# Patient Record
Sex: Female | Born: 1955 | Race: Black or African American | Hispanic: No | Smoking: Never smoker
Health system: Southern US, Community
[De-identification: ages and names within clinical notes are randomized; demographics above are authoritative.]

## PROBLEM LIST (undated history)

## (undated) DIAGNOSIS — E119 Type 2 diabetes mellitus without complications: Secondary | ICD-10-CM

## (undated) HISTORY — PX: OVARIAN CYST REMOVAL: SHX89

---

## 2004-02-13 IMAGING — MG MM DIGITAL SCREENING BILAT W/ CAD
1 series · 6 of 6 positions shown · non-contrast
Comparison: none

REASON FOR EXAM: BAIAT screening...hm [PHONE_NUMBER]

Procedure: DIGITAL BILATERAL SCREENING MAMMOGRAPHY WITH CAD
 Dense nodular parenchymal pattern is present. CAD evaluation is nonfocal.
Routine yearly follow up is suggested.

[R MLO · right · 6 of 6 slices shown]
[im 1/6]
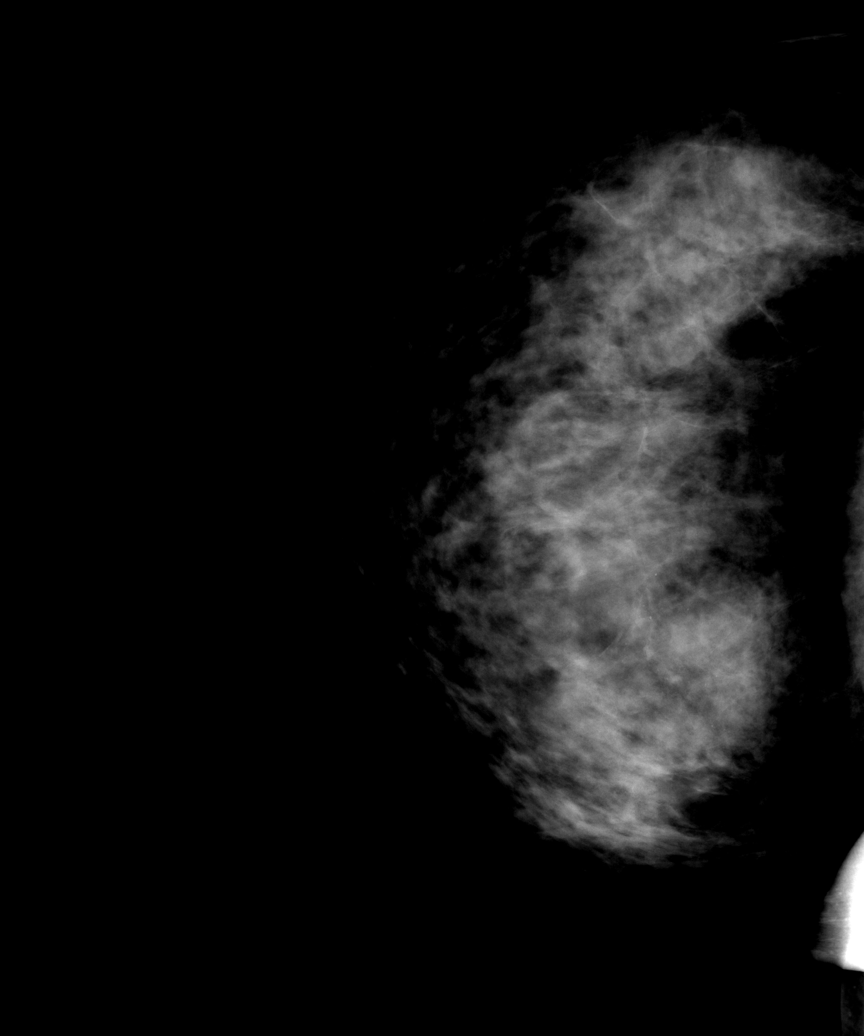
[im 2/6]
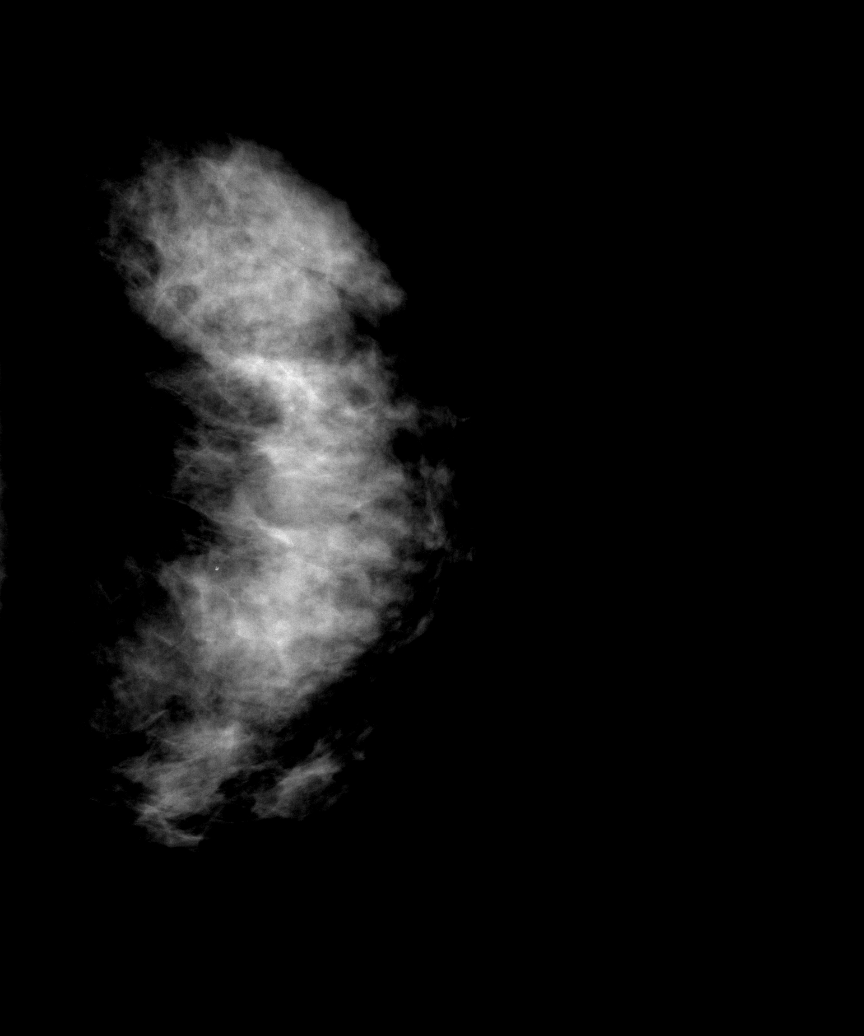
[im 3/6]
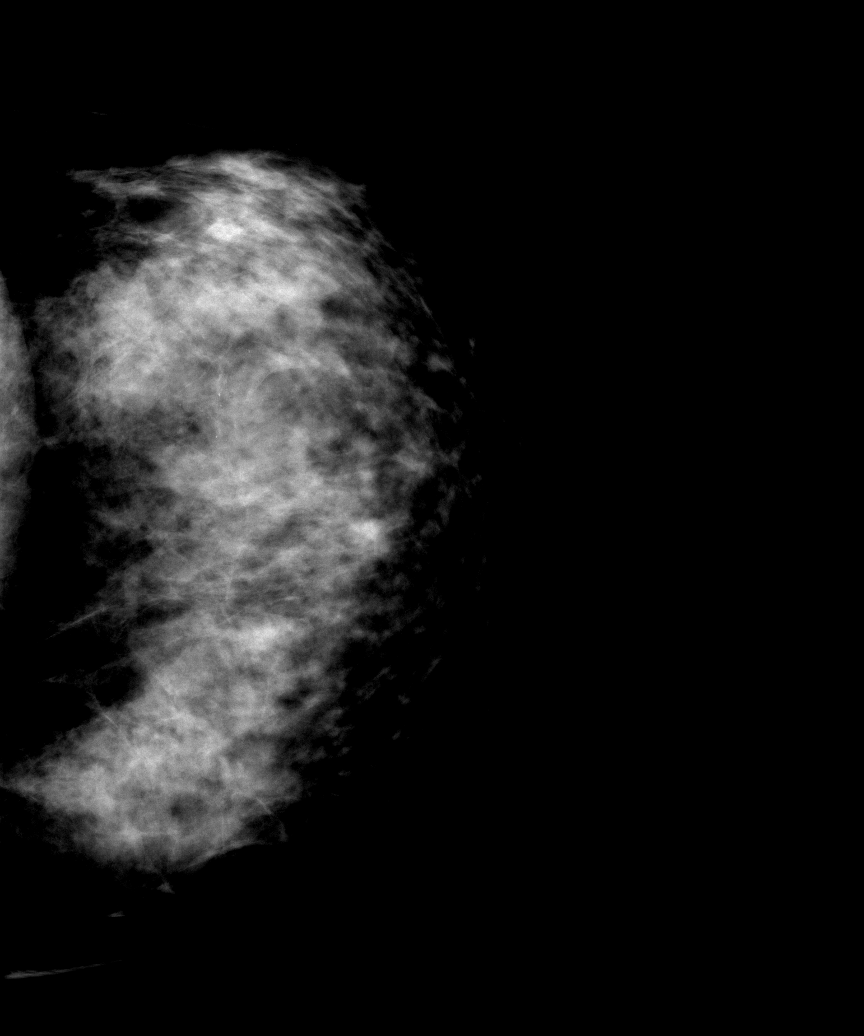
[im 4/6]
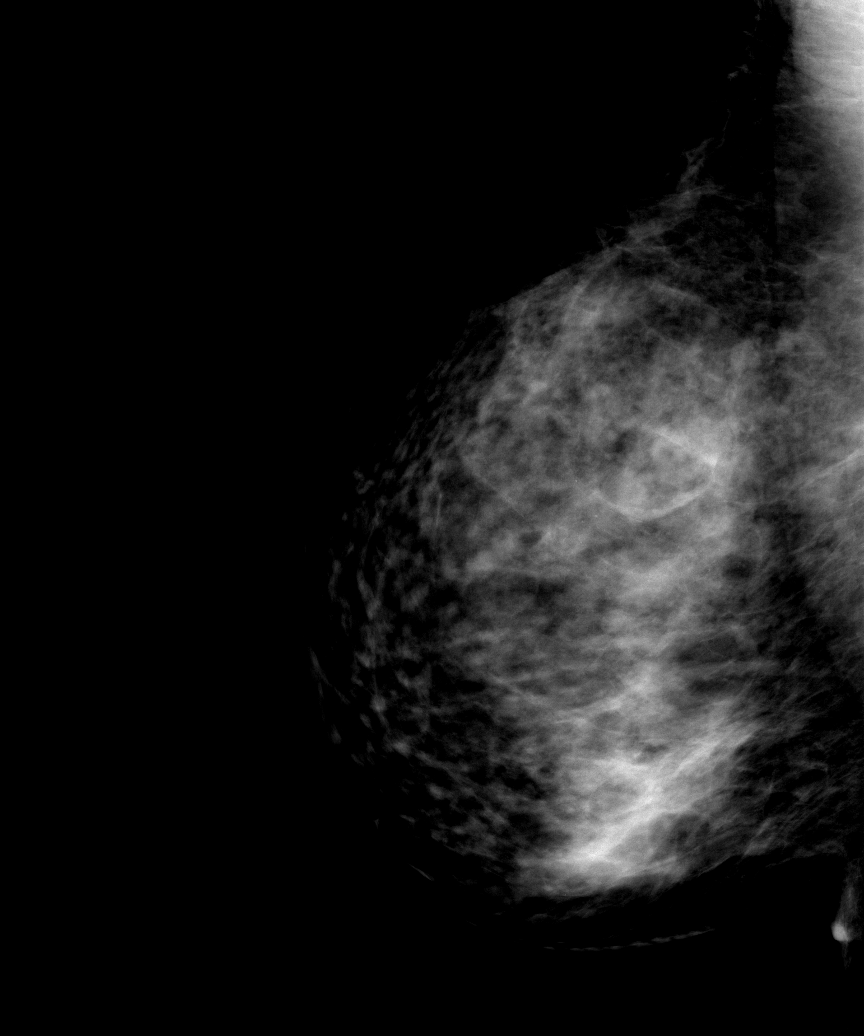
[im 5/6]
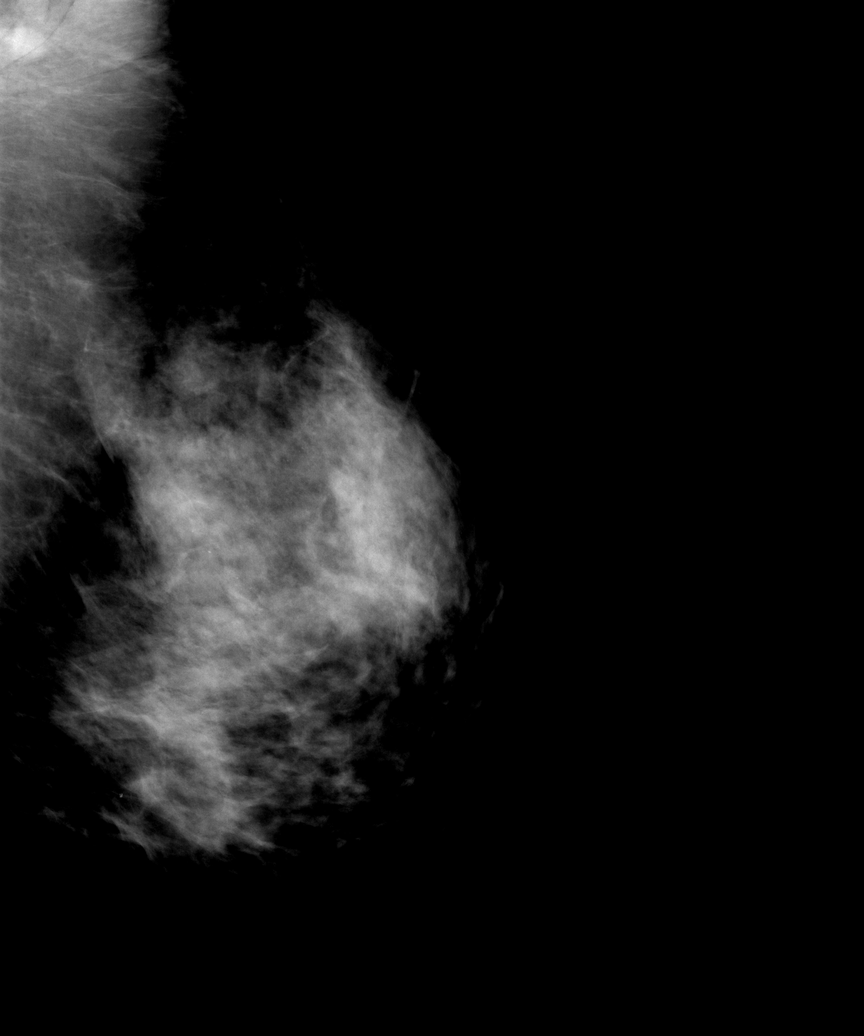
[im 6/6]
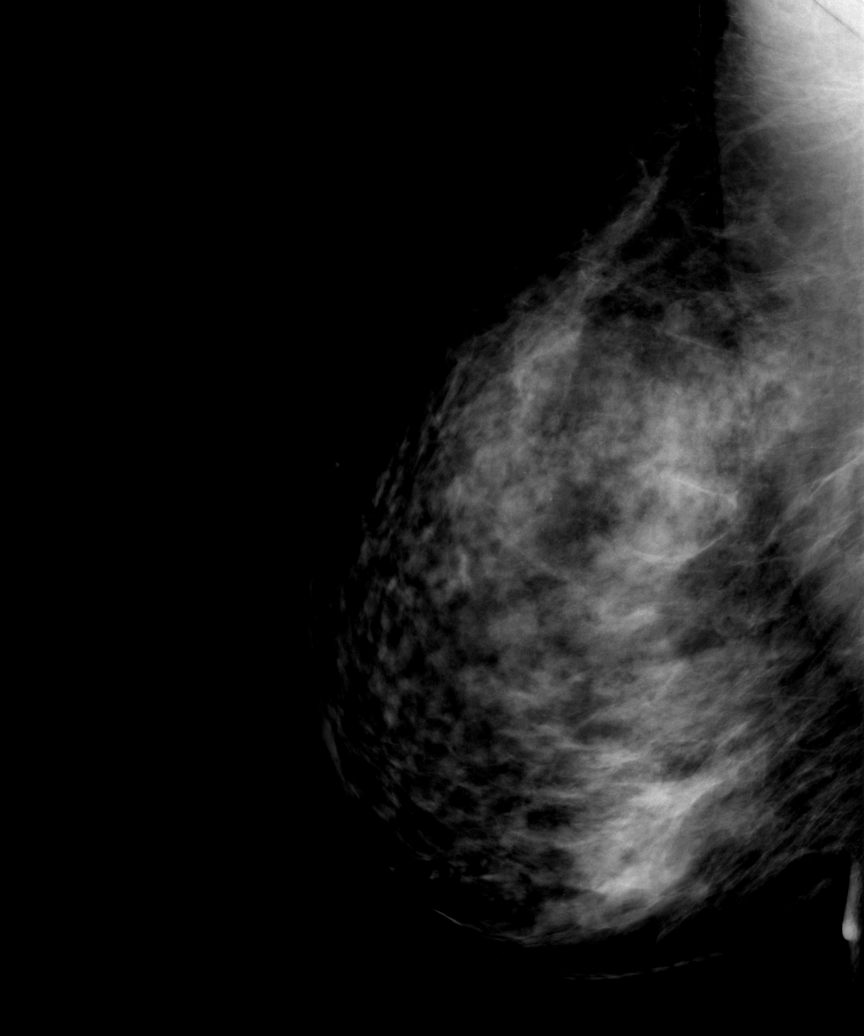

[6 of 6 positions shown; findings below may reference images not displayed]

IMPRESSION: Stable benign exam. Yearly follow up mammogram is suggested. BI-RADS:
Category 2-Benign Findings.

 A NEGATIVE MAMMOGRAM REPORT DOES NOT PRECLUDE BIOPSY OR OTHER EVALUATION OF
A CLINICALLY PALPABLE OR OTHERWISE SUSPICIOUS MASS OR LESION. BREAST CANCER
MAY NOT BE DETECTED BY MAMMOGRAPHY IN UP TO 10% OF CASES.

## 2016-08-05 ENCOUNTER — Other Ambulatory Visit: Payer: Self-pay | Admitting: Family Medicine

## 2016-08-05 DIAGNOSIS — N6002 Solitary cyst of left breast: Secondary | ICD-10-CM

## 2016-08-09 ENCOUNTER — Ambulatory Visit
Admission: RE | Admit: 2016-08-09 | Discharge: 2016-08-09 | Disposition: A | Discharge status: Home / Self Care | Payer: BLUE CROSS/BLUE SHIELD | Source: Ambulatory Visit | Attending: Family Medicine | Admitting: Family Medicine

## 2016-08-09 ENCOUNTER — Other Ambulatory Visit: Payer: Self-pay | Admitting: Family Medicine

## 2016-08-09 ENCOUNTER — Other Ambulatory Visit: Payer: Self-pay

## 2016-08-09 DIAGNOSIS — N6002 Solitary cyst of left breast: Secondary | ICD-10-CM

## 2016-08-24 ENCOUNTER — Ambulatory Visit: Payer: Self-pay | Admitting: General Surgery

## 2016-08-24 DIAGNOSIS — N6092 Unspecified benign mammary dysplasia of left breast: Secondary | ICD-10-CM

## 2016-08-28 ENCOUNTER — Telehealth: Payer: Self-pay | Admitting: Hematology

## 2016-08-28 ENCOUNTER — Encounter: Payer: Self-pay | Admitting: Hematology

## 2016-08-28 NOTE — Telephone Encounter (Signed)
Appt has been scheduled for the pt to see Dr.Feng on 3/26 at 11am. Demographics verified. Letter mailed.

## 2016-09-20 ENCOUNTER — Encounter: Payer: BLUE CROSS/BLUE SHIELD | Admitting: Hematology

## 2016-09-27 ENCOUNTER — Telehealth: Payer: Self-pay | Admitting: Hematology

## 2016-09-27 NOTE — Telephone Encounter (Signed)
Tc to the pt to see if she would like to reschedule her appt. Pt stated that she wants to wait until after she speaks to Dr. Carolynne Edouard at [facility]. She states that she will cb when she's ready to schedule. Referral cancelled until then.

## 2016-09-29 ENCOUNTER — Other Ambulatory Visit: Payer: Self-pay | Admitting: General Surgery

## 2016-09-29 DIAGNOSIS — N6092 Unspecified benign mammary dysplasia of left breast: Secondary | ICD-10-CM

## 2016-10-12 ENCOUNTER — Encounter (HOSPITAL_COMMUNITY): Payer: Self-pay | Admitting: *Deleted

## 2016-10-12 ENCOUNTER — Encounter (HOSPITAL_COMMUNITY)
Admission: RE | Admit: 2016-10-12 | Discharge: 2016-10-12 | Disposition: A | Discharge status: Home / Self Care | Payer: BLUE CROSS/BLUE SHIELD | Source: Ambulatory Visit | Attending: General Surgery | Admitting: General Surgery

## 2016-10-12 DIAGNOSIS — N6092 Unspecified benign mammary dysplasia of left breast: Secondary | ICD-10-CM | POA: Insufficient documentation

## 2016-10-12 DIAGNOSIS — Z01810 Encounter for preprocedural cardiovascular examination: Secondary | ICD-10-CM | POA: Diagnosis not present

## 2016-10-12 DIAGNOSIS — Z01818 Encounter for other preprocedural examination: Secondary | ICD-10-CM | POA: Diagnosis not present

## 2016-10-12 HISTORY — DX: Type 2 diabetes mellitus without complications: E11.9

## 2016-10-12 LAB — CBC
HCT: 37 % (ref 36.0–46.0)
HEMOGLOBIN: 11.8 g/dL — AB (ref 12.0–15.0)
MCH: 28.8 pg (ref 26.0–34.0)
MCHC: 31.9 g/dL (ref 30.0–36.0)
MCV: 90.2 fL (ref 78.0–100.0)
PLATELETS: 414 10*3/uL — AB (ref 150–400)
RBC: 4.1 MIL/uL (ref 3.87–5.11)
RDW: 12.2 % (ref 11.5–15.5)
WBC: 5 10*3/uL (ref 4.0–10.5)

## 2016-10-12 LAB — BASIC METABOLIC PANEL
ANION GAP: 9 (ref 5–15)
BUN: 12 mg/dL (ref 6–20)
CALCIUM: 9 mg/dL (ref 8.9–10.3)
CO2: 25 mmol/L (ref 22–32)
CREATININE: 1.02 mg/dL — AB (ref 0.44–1.00)
Chloride: 103 mmol/L (ref 101–111)
GFR, EST NON AFRICAN AMERICAN: 59 mL/min — AB (ref >60)
Glucose, Bld: 146 mg/dL — ABNORMAL HIGH (ref 65–99)
Potassium: 3.7 mmol/L (ref 3.5–5.1)
Sodium: 137 mmol/L (ref 135–145)

## 2016-10-12 LAB — GLUCOSE, CAPILLARY: Glucose-Capillary: 136 mg/dL — ABNORMAL HIGH (ref 65–99)

## 2016-10-12 NOTE — Pre-Procedure Instructions (Addendum)
Katherine May  10/12/2016      [facility] [address] [place], [place] - [address] [place] [address] Phone: [phone] Fax: [phone]    Your procedure is scheduled on 10/18/16  Report to [facility] Admitting at  1030   A.M.  Call this number if you have problems the morning of surgery:  [phone]   Remember:  Do not eat food or drink liquids after midnight.  Take these medicines the morning of surgery with A SIP OF WATER  Tylenol if needed           Drink small bottle of water  (given to you at preadmit visit) at 830  STOP all herbel meds, nsaids (aleve,naproxen,advil,ibuprofen)  Starting TODAY (10/12/16) including all vitamins/supplements, aspirin  No actos day of surgery     How to Manage Your Diabetes Before and After Surgery  Why is it important to control my blood sugar before and after surgery? . Improving blood sugar levels before and after surgery helps healing and can limit problems. . A way of improving blood sugar control is eating a healthy diet by: o  Eating less sugar and carbohydrates o  Increasing activity/exercise o  Talking with your doctor about reaching your blood sugar goals . High blood sugars (greater than 180 mg/dL) can raise your risk of infections and slow your recovery, so you will need to focus on controlling your diabetes during the weeks before surgery. . Make sure that the doctor who takes care of your diabetes knows about your planned surgery including the date and location.  How do I manage my blood sugar before surgery? . Check your blood sugar at least 4 times a day, starting 2 days before surgery, to make sure that the level is not too high or low. o Check your blood sugar the morning of your surgery when you wake up and every 2 hours until you get to the Short Stay unit. . If your blood sugar is less than 70 mg/dL, you will need to treat for low blood sugar: o Do not take  insulin. o Treat a low blood sugar (less than 70 mg/dL) with  cup of clear juice (cranberry or apple), 4 glucose tablets, OR glucose gel. o Recheck blood sugar in 15 minutes after treatment (to make sure it is greater than 70 mg/dL). If your blood sugar is not greater than 70 mg/dL on recheck, call [phone] for further instructions. . Report your blood sugar to the short stay nurse when you get to Short Stay.  . If you are admitted to the hospital after surgery: o Your blood sugar will be checked by the staff and you will probably be given insulin after surgery (instead of oral diabetes medicines) to make sure you have good blood sugar levels. o The goal for blood sugar control after surgery is 80-180 mg/dL.   WHAT DO I DO ABOUT MY DIABETES MEDICATION?  Marland Kitchen Do not take oral diabetes medicines (pills) the morning of surgery.No actos day of surgery    Do not wear jewelry, make-up or nail polish.  Do not wear lotions, powders, or perfumes, or deoderant.  Do not shave 48 hours prior to surgery.  Men may shave face and neck.  Do not bring valuables to the hospital.  [facility] is not responsible for any belongings or valuables.  Contacts, dentures or bridgework may not be worn into surgery.  Leave  your suitcase in the car.  After surgery it may be brought to your room.  For patients admitted to the hospital, discharge time will be determined by your treatment team.  Patients discharged the day of surgery will not be allowed to drive home.   Special instructions:   Special Instructions: [facility] - Preparing for Surgery  Before surgery, you can play an important role.  Because skin is not sterile, your skin needs to be as free of germs as possible.  You can reduce the number of germs on you skin by washing with CHG (chlorahexidine gluconate) soap before surgery.  CHG is an antiseptic cleaner which kills germs and bonds with the skin to continue killing germs even after washing.  Please  DO NOT use if you have an allergy to CHG or antibacterial soaps.  If your skin becomes reddened/irritated stop using the CHG and inform your nurse when you arrive at Short Stay.  Do not shave (including legs and underarms) for at least 48 hours prior to the first CHG shower.  You may shave your face.  Please follow these instructions carefully:   1.  Shower with CHG Soap the night before surgery and the morning of Surgery.  2.  If you choose to wash your hair, wash your hair first as usual with your normal shampoo.  3.  After you shampoo, rinse your hair and body thoroughly to remove the Shampoo.  4.  Use CHG as you would any other liquid soap.  You can apply chg directly  to the skin and wash gently with scrungie or a clean washcloth.  5.  Apply the CHG Soap to your body ONLY FROM THE NECK DOWN.  Do not use on open wounds or open sores.  Avoid contact with your eyes ears, mouth and genitals (private parts).  Wash genitals (private parts)       with your normal soap.  6.  Wash thoroughly, paying special attention to the area where your surgery will be performed.  7.  Thoroughly rinse your body with warm water from the neck down.  8.  DO NOT shower/wash with your normal soap after using and rinsing off the CHG Soap.  9.  Pat yourself dry with a clean towel.            10.  Wear clean pajamas.            11.  Place clean sheets on your bed the night of your first shower and do not sleep with pets.  Day of Surgery  Do not apply any lotions/deodorants the morning of surgery.  Please wear clean clothes to the hospital/surgery center.  Please read over the  fact sheets that you were given.

## 2016-10-13 LAB — HEMOGLOBIN A1C
Hgb A1c MFr Bld: 6.7 % — ABNORMAL HIGH (ref 4.8–5.6)
Mean Plasma Glucose: 146 mg/dL

## 2016-10-15 ENCOUNTER — Ambulatory Visit
Admission: RE | Admit: 2016-10-15 | Discharge: 2016-10-15 | Disposition: A | Discharge status: Home / Self Care | Payer: BLUE CROSS/BLUE SHIELD | Source: Ambulatory Visit | Attending: General Surgery | Admitting: General Surgery

## 2016-10-15 DIAGNOSIS — N6092 Unspecified benign mammary dysplasia of left breast: Secondary | ICD-10-CM

## 2016-10-18 ENCOUNTER — Ambulatory Visit (HOSPITAL_COMMUNITY): Payer: BLUE CROSS/BLUE SHIELD | Admitting: Anesthesiology

## 2016-10-18 ENCOUNTER — Ambulatory Visit (HOSPITAL_COMMUNITY)
Admission: RE | Admit: 2016-10-18 | Discharge: 2016-10-18 | Disposition: A | Discharge status: Home / Self Care | Payer: BLUE CROSS/BLUE SHIELD | Source: Ambulatory Visit | Attending: General Surgery | Admitting: General Surgery

## 2016-10-18 ENCOUNTER — Encounter (HOSPITAL_COMMUNITY): Payer: Self-pay | Admitting: *Deleted

## 2016-10-18 ENCOUNTER — Encounter (HOSPITAL_COMMUNITY)
Admission: RE | Disposition: A | Discharge status: Home / Self Care | Payer: Self-pay | Source: Ambulatory Visit | Attending: General Surgery

## 2016-10-18 ENCOUNTER — Ambulatory Visit
Admission: RE | Admit: 2016-10-18 | Discharge: 2016-10-18 | Disposition: A | Discharge status: Home / Self Care | Payer: BLUE CROSS/BLUE SHIELD | Source: Ambulatory Visit | Attending: General Surgery | Admitting: General Surgery

## 2016-10-18 DIAGNOSIS — N62 Hypertrophy of breast: Secondary | ICD-10-CM | POA: Diagnosis not present

## 2016-10-18 DIAGNOSIS — E119 Type 2 diabetes mellitus without complications: Secondary | ICD-10-CM | POA: Diagnosis not present

## 2016-10-18 DIAGNOSIS — Z7982 Long term (current) use of aspirin: Secondary | ICD-10-CM | POA: Insufficient documentation

## 2016-10-18 DIAGNOSIS — N6092 Unspecified benign mammary dysplasia of left breast: Secondary | ICD-10-CM | POA: Diagnosis present

## 2016-10-18 DIAGNOSIS — Z7984 Long term (current) use of oral hypoglycemic drugs: Secondary | ICD-10-CM | POA: Insufficient documentation

## 2016-10-18 DIAGNOSIS — Z803 Family history of malignant neoplasm of breast: Secondary | ICD-10-CM | POA: Diagnosis not present

## 2016-10-18 DIAGNOSIS — Z79899 Other long term (current) drug therapy: Secondary | ICD-10-CM | POA: Diagnosis not present

## 2016-10-18 HISTORY — PX: BREAST LUMPECTOMY WITH RADIOACTIVE SEED LOCALIZATION: SHX6424

## 2016-10-18 LAB — GLUCOSE, CAPILLARY
GLUCOSE-CAPILLARY: 76 mg/dL (ref 65–99)
Glucose-Capillary: 82 mg/dL (ref 65–99)
Glucose-Capillary: 93 mg/dL (ref 65–99)

## 2016-10-18 SURGERY — BREAST LUMPECTOMY WITH RADIOACTIVE SEED LOCALIZATION
Anesthesia: General | Site: Breast | Laterality: Left

## 2016-10-18 MED ORDER — LIDOCAINE 2% (20 MG/ML) 5 ML SYRINGE
INTRAMUSCULAR | Status: AC
  Filled 2016-10-18: qty 5

## 2016-10-18 MED ORDER — MIDAZOLAM HCL 5 MG/5ML IJ SOLN
INTRAMUSCULAR | Status: DC | PRN
  Administered 2016-10-18: 2 mg via INTRAVENOUS

## 2016-10-18 MED ORDER — FENTANYL CITRATE (PF) 100 MCG/2ML IJ SOLN
INTRAMUSCULAR | Status: AC
  Administered 2016-10-18: 50 ug via INTRAVENOUS
  Filled 2016-10-18: qty 2

## 2016-10-18 MED ORDER — CEFAZOLIN SODIUM-DEXTROSE 2-4 GM/100ML-% IV SOLN
2.0000 g | INTRAVENOUS | Status: AC
  Administered 2016-10-18: 2 g via INTRAVENOUS
  Filled 2016-10-18: qty 100

## 2016-10-18 MED ORDER — CHLORHEXIDINE GLUCONATE CLOTH 2 % EX PADS: 6.0000 | MEDICATED_PAD | Freq: Once | CUTANEOUS | Status: DC

## 2016-10-18 MED ORDER — BUPIVACAINE HCL (PF) 0.25 % IJ SOLN
INTRAMUSCULAR | Status: DC | PRN
Start: 2016-10-18 — End: 2016-10-18
  Administered 2016-10-18: 10 mL

## 2016-10-18 MED ORDER — PROPOFOL 10 MG/ML IV BOLUS
INTRAVENOUS | Status: DC | PRN
  Administered 2016-10-18: 160 mg via INTRAVENOUS

## 2016-10-18 MED ORDER — DEXAMETHASONE SODIUM PHOSPHATE 10 MG/ML IJ SOLN
INTRAMUSCULAR | Status: DC | PRN
  Administered 2016-10-18: 10 mg via INTRAVENOUS

## 2016-10-18 MED ORDER — HYDROCODONE-ACETAMINOPHEN 5-325 MG PO TABS
ORAL_TABLET | ORAL | Status: AC
  Administered 2016-10-18: 1 via ORAL
  Filled 2016-10-18: qty 1

## 2016-10-18 MED ORDER — BUPIVACAINE HCL (PF) 0.25 % IJ SOLN
INTRAMUSCULAR | Status: AC
  Filled 2016-10-18: qty 30

## 2016-10-18 MED ORDER — FENTANYL CITRATE (PF) 100 MCG/2ML IJ SOLN
25.0000 ug | INTRAMUSCULAR | Status: DC | PRN
  Administered 2016-10-18: 50 ug via INTRAVENOUS
  Administered 2016-10-18 (×2): 25 ug via INTRAVENOUS

## 2016-10-18 MED ORDER — HYDROCODONE-ACETAMINOPHEN 5-325 MG PO TABS: 1.0000 | ORAL_TABLET | ORAL | 0 refills | Status: AC | PRN

## 2016-10-18 MED ORDER — FENTANYL CITRATE (PF) 250 MCG/5ML IJ SOLN
INTRAMUSCULAR | Status: AC
  Filled 2016-10-18: qty 5

## 2016-10-18 MED ORDER — PROPOFOL 10 MG/ML IV BOLUS
INTRAVENOUS | Status: AC
  Filled 2016-10-18: qty 20

## 2016-10-18 MED ORDER — 0.9 % SODIUM CHLORIDE (POUR BTL) OPTIME
TOPICAL | Status: DC | PRN
  Administered 2016-10-18: 1000 mL

## 2016-10-18 MED ORDER — CELECOXIB 200 MG PO CAPS
400.0000 mg | ORAL_CAPSULE | ORAL | Status: AC
  Administered 2016-10-18: 400 mg via ORAL
  Filled 2016-10-18: qty 2

## 2016-10-18 MED ORDER — HYDROCODONE-ACETAMINOPHEN 5-325 MG PO TABS
1.0000 | ORAL_TABLET | Freq: Once | ORAL | Status: AC
  Administered 2016-10-18: 1 via ORAL

## 2016-10-18 MED ORDER — ACETAMINOPHEN 500 MG PO TABS
1000.0000 mg | ORAL_TABLET | ORAL | Status: AC
  Administered 2016-10-18: 1000 mg via ORAL
  Filled 2016-10-18: qty 2

## 2016-10-18 MED ORDER — LACTATED RINGERS IV SOLN
INTRAVENOUS | Status: DC
  Administered 2016-10-18: 11:00:00 via INTRAVENOUS

## 2016-10-18 MED ORDER — MIDAZOLAM HCL 2 MG/2ML IJ SOLN
INTRAMUSCULAR | Status: AC
  Filled 2016-10-18: qty 2

## 2016-10-18 MED ORDER — GABAPENTIN 300 MG PO CAPS
300.0000 mg | ORAL_CAPSULE | ORAL | Status: AC
  Administered 2016-10-18: 300 mg via ORAL
  Filled 2016-10-18: qty 1

## 2016-10-18 MED ORDER — FENTANYL CITRATE (PF) 100 MCG/2ML IJ SOLN
INTRAMUSCULAR | Status: DC | PRN
  Administered 2016-10-18 (×3): 50 ug via INTRAVENOUS

## 2016-10-18 MED ORDER — ONDANSETRON HCL 4 MG/2ML IJ SOLN
INTRAMUSCULAR | Status: DC | PRN
  Administered 2016-10-18: 4 mg via INTRAVENOUS

## 2016-10-18 MED ORDER — LIDOCAINE HCL (CARDIAC) 20 MG/ML IV SOLN
INTRAVENOUS | Status: DC | PRN
  Administered 2016-10-18: 100 mg via INTRAVENOUS

## 2016-10-18 SURGICAL SUPPLY — 37 items
APPLIER CLIP 9.375 MED OPEN (MISCELLANEOUS)
BLADE SURG 15 STRL LF DISP TIS (BLADE) ×1 IMPLANT
BLADE SURG 15 STRL SS (BLADE) ×2
CANISTER SUCT 3000ML PPV (MISCELLANEOUS) ×3 IMPLANT
CHLORAPREP W/TINT 26ML (MISCELLANEOUS) ×3 IMPLANT
CLIP APPLIE 9.375 MED OPEN (MISCELLANEOUS) IMPLANT
COVER PROBE W GEL 5X96 (DRAPES) ×3 IMPLANT
COVER SURGICAL LIGHT HANDLE (MISCELLANEOUS) ×3 IMPLANT
DERMABOND ADVANCED (GAUZE/BANDAGES/DRESSINGS) ×2
DERMABOND ADVANCED .7 DNX12 (GAUZE/BANDAGES/DRESSINGS) ×1 IMPLANT
DEVICE DUBIN SPECIMEN MAMMOGRA (MISCELLANEOUS) ×3 IMPLANT
DRAPE CHEST BREAST 15X10 FENES (DRAPES) ×3 IMPLANT
DRAPE UTILITY XL STRL (DRAPES) ×3 IMPLANT
ELECT COATED BLADE 2.86 ST (ELECTRODE) ×3 IMPLANT
ELECT REM PT RETURN 9FT ADLT (ELECTROSURGICAL) ×3
ELECTRODE REM PT RTRN 9FT ADLT (ELECTROSURGICAL) ×1 IMPLANT
GLOVE BIO SURGEON STRL SZ7.5 (GLOVE) ×6 IMPLANT
GOWN STRL REUS W/ TWL LRG LVL3 (GOWN DISPOSABLE) ×2 IMPLANT
GOWN STRL REUS W/TWL LRG LVL3 (GOWN DISPOSABLE) ×4
KIT BASIN OR (CUSTOM PROCEDURE TRAY) ×3 IMPLANT
KIT MARKER MARGIN INK (KITS) ×3 IMPLANT
LIGHT WAVEGUIDE WIDE FLAT (MISCELLANEOUS) IMPLANT
NEEDLE HYPO 25X1 1.5 SAFETY (NEEDLE) ×3 IMPLANT
NS IRRIG 1000ML POUR BTL (IV SOLUTION) ×3 IMPLANT
PACK SURGICAL SETUP 50X90 (CUSTOM PROCEDURE TRAY) ×3 IMPLANT
PENCIL BUTTON HOLSTER BLD 10FT (ELECTRODE) ×3 IMPLANT
SPONGE LAP 18X18 X RAY DECT (DISPOSABLE) ×3 IMPLANT
SUT MNCRL AB 4-0 PS2 18 (SUTURE) ×3 IMPLANT
SUT SILK 2 0 SH (SUTURE) IMPLANT
SUT VIC AB 3-0 SH 18 (SUTURE) ×3 IMPLANT
SYR BULB 3OZ (MISCELLANEOUS) ×3 IMPLANT
SYR CONTROL 10ML LL (SYRINGE) ×3 IMPLANT
TOWEL OR 17X24 6PK STRL BLUE (TOWEL DISPOSABLE) ×3 IMPLANT
TOWEL OR 17X26 10 PK STRL BLUE (TOWEL DISPOSABLE) ×3 IMPLANT
TUBE CONNECTING 12'X1/4 (SUCTIONS) ×1
TUBE CONNECTING 12X1/4 (SUCTIONS) ×2 IMPLANT
YANKAUER SUCT BULB TIP NO VENT (SUCTIONS) ×3 IMPLANT

## 2016-10-18 NOTE — Anesthesia Postprocedure Evaluation (Signed)
Anesthesia Post Note  Patient: Katherine May  Procedure(s) Performed: Procedure(s) (LRB): LEFT BREAST LUMPECTOMY WITH RADIOACTIVE SEED LOCALIZATION (Left)  Patient location during evaluation: PACU Anesthesia Type: General Level of consciousness: awake Pain management: pain level controlled Respiratory status: spontaneous breathing Cardiovascular status: stable Anesthetic complications: no       Last Vitals:  Vitals:   10/18/16 1042 10/18/16 1345  BP: (!) 164/72 (!) 154/87  Pulse: 73 65  Resp: 20 14  Temp: 36.5 C 36.4 C    Last Pain:  Vitals:   10/18/16 1042  TempSrc: Oral                 Magaret Justo

## 2016-10-18 NOTE — Anesthesia Preprocedure Evaluation (Addendum)
Anesthesia Evaluation  Patient identified by MRN, date of birth, ID band Patient awake    Reviewed: Allergy & Precautions, NPO status , Patient's Chart, lab work & pertinent test results  Airway Mallampati: II  TM Distance: >3 FB     Dental   Pulmonary neg pulmonary ROS,    breath sounds clear to auscultation       Cardiovascular negative cardio ROS   Rhythm:Regular Rate:Normal     Neuro/Psych negative neurological ROS     GI/Hepatic negative GI ROS, Neg liver ROS,   Endo/Other  diabetes  Renal/GU negative Renal ROS     Musculoskeletal   Abdominal   Peds  Hematology   Anesthesia Other Findings   Reproductive/Obstetrics                             Anesthesia Physical Anesthesia Plan  ASA: II  Anesthesia Plan: General   Post-op Pain Management:    Induction:   Airway Management Planned: LMA  Additional Equipment:   Intra-op Plan:   Post-operative Plan: Extubation in OR  Informed Consent: I have reviewed the patients History and Physical, chart, labs and discussed the procedure including the risks, benefits and alternatives for the proposed anesthesia with the patient or authorized representative who has indicated his/her understanding and acceptance.   Dental advisory given  Plan Discussed with: CRNA and Anesthesiologist  Anesthesia Plan Comments:         Anesthesia Quick Evaluation

## 2016-10-18 NOTE — Transfer of Care (Signed)
Immediate Anesthesia Transfer of Care Note  Patient: Katherine May  Procedure(s) Performed: Procedure(s): LEFT BREAST LUMPECTOMY WITH RADIOACTIVE SEED LOCALIZATION (Left)  Patient Location: PACU  Anesthesia Type:General  Level of Consciousness: awake, alert , oriented and patient cooperative  Airway & Oxygen Therapy: Patient Spontanous Breathing and Patient connected to nasal cannula oxygen  Post-op Assessment: Report given to RN and Post -op Vital signs reviewed and stable  Post vital signs: Reviewed and stable  Last Vitals:  Vitals:   10/18/16 1042  BP: (!) 164/72  Pulse: 73  Resp: 20  Temp: 36.5 C    Last Pain:  Vitals:   10/18/16 1042  TempSrc: Oral      Patients Stated Pain Goal: 8 (10/18/16 1049)  Complications: No apparent anesthesia complications

## 2016-10-18 NOTE — H&P (Signed)
Katherine May  Location: [facility] Patient #: [id number] DOB: 1955-11-12 Widowed / Language: [place] / Race: Black or African American Female   History of Present Illness  The patient is a 61 year old female who presents with a breast mass. We are asked to see the patient in consultation by Dr. Patriciaann Clan to evaluate her for a left breast mass. The patient is a 61 year old black female who recently went for a routine screening mammogram at [facility]. At that time she was found to have an abnormality in the outer aspect of the left breast. This was biopsied and came back as atypical lobular hyperplasia. She denies any breast pain or discharge from the nipple. She does have a family history significant for a sister with breast cancer at the age of 29. She does not smoke or take any hormone replacement.   Allergies  No Known Drug Allergies  Allergies Reconciled   Medication History  Atorvastatin Calcium (  Tablet, Oral daily) Active. Pioglitazone HCl (  Tablet, Oral daily) Active. Medications Reconciled    Review of Systems  General Not Present- Appetite Loss, Chills, Fatigue, Fever, Night Sweats, Weight Gain and Weight Loss. Skin Not Present- Change in Wart/Mole, Dryness, Hives, Jaundice, New Lesions, Non-Healing Wounds, Rash and Ulcer. HEENT Not Present- Earache, Hearing Loss, Hoarseness, Nose Bleed, Oral Ulcers, Ringing in the Ears, Seasonal Allergies, Sinus Pain, Sore Throat, Visual Disturbances, Wears glasses/contact lenses and Yellow Eyes. Respiratory Not Present- Bloody sputum, Chronic Cough, Difficulty Breathing, Snoring and Wheezing. Breast Not Present- Breast Mass, Breast Pain, Nipple Discharge and Skin Changes. Cardiovascular Not Present- Chest Pain, Difficulty Breathing Lying Down, Leg Cramps, Palpitations, Rapid Heart Rate, Shortness of Breath and Swelling of Extremities. Gastrointestinal Not Present- Abdominal Pain, Bloating, Bloody Stool,  Change in Bowel Habits, Chronic diarrhea, Constipation, Difficulty Swallowing, Excessive gas, Gets full quickly at meals, Hemorrhoids, Indigestion, Nausea, Rectal Pain and Vomiting. Female Genitourinary Not Present- Frequency, Nocturia, Painful Urination, Pelvic Pain and Urgency. Musculoskeletal Not Present- Back Pain, Joint Pain, Joint Stiffness, Muscle Pain, Muscle Weakness and Swelling of Extremities. Neurological Not Present- Decreased Memory, Fainting, Headaches, Numbness, Seizures, Tingling, Tremor, Trouble walking and Weakness. Psychiatric Not Present- Anxiety, Bipolar, Change in Sleep Pattern, Depression, Fearful and Frequent crying. Endocrine Not Present- Cold Intolerance, Excessive Hunger, Hair Changes, Heat Intolerance, Hot flashes and New Diabetes. Hematology Not Present- Easy Bruising, Excessive bleeding, Gland problems, HIV and Persistent Infections.  Vitals Weight: 200.6 lb Height: 63in Body Surface Area: 1.94 m Body Mass Index: 35.53 kg/m  Temp.: 98.82F  Pulse: 80 (Regular)  BP: 134/82 (Sitting, Left Arm, Standard)       Physical Exam  General Mental Status-Alert. General Appearance-Consistent with stated age. Hydration-Well hydrated. Voice-Normal.  Head and Neck Head-normocephalic, atraumatic with no lesions or palpable masses. Trachea-midline. Thyroid Gland Characteristics - normal size and consistency.  Eye Eyeball - Bilateral-Extraocular movements intact. Sclera/Conjunctiva - Bilateral-No scleral icterus.  Chest and Lung Exam Chest and lung exam reveals -quiet, even and easy respiratory effort with no use of accessory muscles and on auscultation, normal breath sounds, no adventitious sounds and normal vocal resonance. Inspection Chest Wall - Normal. Back - normal.  Breast Note: There is no palpable mass in either breast. There is no palpable axillary, supraclavicular, or cervical  lymphadenopathy.   Cardiovascular Cardiovascular examination reveals -normal heart sounds, regular rate and rhythm with no murmurs and normal pedal pulses bilaterally.  Abdomen Inspection Inspection of the abdomen reveals - No Hernias. Skin - Scar - no surgical scars.  Palpation/Percussion Palpation and Percussion of the abdomen reveal - Soft, Non Tender, No Rebound tenderness, No Rigidity (guarding) and No hepatosplenomegaly. Auscultation Auscultation of the abdomen reveals - Bowel sounds normal.  Neurologic Neurologic evaluation reveals -alert and oriented x 3 with no impairment of recent or remote memory. Mental Status-Normal.  Musculoskeletal Normal Exam - Left-Upper Extremity Strength Normal and Lower Extremity Strength Normal. Normal Exam - Right-Upper Extremity Strength Normal and Lower Extremity Strength Normal.  Lymphatic Head & Neck  General Head & Neck Lymphatics: Bilateral - Description - Normal. Axillary  General Axillary Region: Bilateral - Description - Normal. Tenderness - Non Tender. Femoral & Inguinal  Generalized Femoral & Inguinal Lymphatics: Bilateral - Description - Normal. Tenderness - Non Tender.    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 Because of its abnormal appearance and because it is considered a high risk lesion I would recommend that this area be removed. I have discussed with her in detail the risks and benefits of the operation to do this as well as some of the technical aspects and she understands and wishes to proceed. I will plan for a left breast radioactive seed localized lumpectomy. I will also make her a referral to the high-risk clinic to talk about risk reduction. Current Plans Referred to Oncology, for evaluation and follow up (Oncology). Routine.

## 2016-10-18 NOTE — Interval H&P Note (Signed)
History and Physical Interval Note:  10/18/2016 12:20 PM  Katherine May  has presented today for surgery, with the diagnosis of LEFT BREAST ATYPICAL LOBULAR HYPERPLASIA  The various methods of treatment have been discussed with the patient and family. After consideration of risks, benefits and other options for treatment, the patient has consented to  Procedure(s): LEFT BREAST LUMPECTOMY WITH RADIOACTIVE SEED LOCALIZATION (Left) as a surgical intervention .  The patient's history has been reviewed, patient examined, no change in status, stable for surgery.  I have reviewed the patient's chart and labs.  Questions were answered to the patient's satisfaction.     TOTH III,[name]

## 2016-10-18 NOTE — Op Note (Signed)
10/18/2016  1:32 PM  PATIENT:  Katherine May  61 y.o. female  PRE-OPERATIVE DIAGNOSIS:  LEFT BREAST ATYPICAL LOBULAR HYPERPLASIA  POST-OPERATIVE DIAGNOSIS:  LEFT BREAST ATYPICAL LOBULAR HYPERPLASIA  PROCEDURE:  Procedure(s): LEFT BREAST LUMPECTOMY WITH RADIOACTIVE SEED LOCALIZATION (Left)  SURGEON:  Surgeon(s) and Role:    * Griselda Miner, MD - Primary  PHYSICIAN ASSISTANT:   ASSISTANTS: none   ANESTHESIA:   local and general  EBL:  No intake/output data recorded.  BLOOD ADMINISTERED:none  DRAINS: none   LOCAL MEDICATIONS USED:  MARCAINE     SPECIMEN:  Source of Specimen:  left breast tissue  DISPOSITION OF SPECIMEN:  PATHOLOGY  COUNTS:  YES  TOURNIQUET:  * No tourniquets in log *  DICTATION: .Dragon Dictation   After informed consent was obtained the patient was brought to the operating room and placed in the supine position on the operating room table. After adequate induction of general anesthesia the patient's left breast was prepped with ChloraPrep, allowed to dry, and draped in usual sterile manner. An appropriate timeout was performed. Previously an I-125 seed was placed in the lateral aspect of the left breast to mark an area of atypical lobular hyperplasia. The neoprobe was set to I-125 in the area of radioactivity was readily identified in the outer aspect of the left breast. The area was infiltrated with quarter percent Marcaine. A curvilinear vertically oriented incision was made overlying the area of radioactivity with a 15 blade knife. The incision was carried through the skin and subcutaneous tissue sharply with electrocautery. While checking the area of radioactivity frequently a circular portion of breast tissue was then excised sharply around the radioactive seed. Once the specimen was removed it was oriented with the appropriate paint colors. A specimen radiograph was obtained that showed the clip and seed to be in the center of the specimen. The specimen was  then sent to pathology for further evaluation. Hemostasis was achieved using the Bovie electrocautery. The wound was irrigated with saline. The deep layer of the wound was closed with interrupted 3-0 Vicryl stitches. The skin was then closed with interrupted 4-0 Monocryl subcuticular stitches. Dermabond dressings were applied. The patient tolerated the procedure well. At the end of the case all needle sponge and instrument counts were correct. The patient was then awakened and taken to recovery in stable condition.  PLAN OF CARE: Discharge to home after PACU  PATIENT DISPOSITION:  PACU - hemodynamically stable.   Delay start of Pharmacological VTE agent (>24hrs) due to surgical blood loss or risk of bleeding: not applicable

## 2016-10-19 ENCOUNTER — Encounter (HOSPITAL_COMMUNITY): Payer: Self-pay | Admitting: General Surgery
